# Patient Record
Sex: Female | Born: 1940 | Race: White | Hispanic: No | Marital: Married
Health system: Southern US, Community
[De-identification: ages and names within clinical notes are randomized; demographics above are authoritative.]

---

## 2009-07-24 ENCOUNTER — Ambulatory Visit (HOSPITAL_BASED_OUTPATIENT_CLINIC_OR_DEPARTMENT_OTHER): Admission: RE | Admit: 2009-07-24 | Discharge: 2009-07-24 | Payer: Self-pay | Admitting: Internal Medicine

## 2009-07-24 ENCOUNTER — Ambulatory Visit: Payer: Self-pay | Admitting: Diagnostic Radiology

## 2012-12-17 ENCOUNTER — Other Ambulatory Visit: Payer: Self-pay | Admitting: Neurosurgery

## 2012-12-17 DIAGNOSIS — M5412 Radiculopathy, cervical region: Secondary | ICD-10-CM

## 2012-12-20 ENCOUNTER — Ambulatory Visit
Admission: RE | Admit: 2012-12-20 | Discharge: 2012-12-20 | Disposition: A | Discharge status: Home / Self Care | Payer: Self-pay | Source: Ambulatory Visit | Attending: Neurosurgery | Admitting: Neurosurgery

## 2012-12-20 VITALS — BP 126/64 | HR 97

## 2012-12-20 DIAGNOSIS — M5412 Radiculopathy, cervical region: Secondary | ICD-10-CM

## 2012-12-20 MED ORDER — IOHEXOL 300 MG/ML  SOLN
10.0000 mL | Freq: Once | INTRAMUSCULAR | Status: AC | PRN
  Administered 2012-12-20: 10 mL via INTRATHECAL

## 2012-12-20 MED ORDER — DIAZEPAM 5 MG PO TABS
5.0000 mg | ORAL_TABLET | Freq: Once | ORAL | Status: AC
  Administered 2012-12-20: 5 mg via ORAL

## 2012-12-20 MED ORDER — MEPERIDINE HCL 100 MG/ML IJ SOLN
75.0000 mg | Freq: Once | INTRAMUSCULAR | Status: AC
  Administered 2012-12-20: 75 mg via INTRAMUSCULAR

## 2012-12-20 MED ORDER — ONDANSETRON HCL 4 MG/2ML IJ SOLN
4.0000 mg | Freq: Once | INTRAMUSCULAR | Status: AC
  Administered 2012-12-20: 4 mg via INTRAMUSCULAR

## 2012-12-20 NOTE — Progress Notes (Signed)
Patient states she hasn't taken Sertraline or Trazodone since Monday (12/17/12).  jkl

## 2013-04-22 ENCOUNTER — Other Ambulatory Visit: Payer: Self-pay | Admitting: Sports Medicine

## 2013-04-22 DIAGNOSIS — M545 Low back pain, unspecified: Secondary | ICD-10-CM

## 2013-04-24 ENCOUNTER — Ambulatory Visit
Admission: RE | Admit: 2013-04-24 | Discharge: 2013-04-24 | Disposition: A | Discharge status: Home / Self Care | Payer: Commercial Managed Care - HMO | Source: Ambulatory Visit | Attending: Sports Medicine | Admitting: Sports Medicine

## 2013-04-24 ENCOUNTER — Other Ambulatory Visit: Payer: Self-pay | Admitting: Sports Medicine

## 2013-04-24 ENCOUNTER — Ambulatory Visit: Payer: No Typology Code available for payment source

## 2013-04-24 DIAGNOSIS — M545 Low back pain, unspecified: Secondary | ICD-10-CM

## 2013-04-25 ENCOUNTER — Other Ambulatory Visit: Payer: No Typology Code available for payment source

## 2013-04-26 ENCOUNTER — Other Ambulatory Visit: Payer: No Typology Code available for payment source

## 2013-04-29 ENCOUNTER — Other Ambulatory Visit: Payer: No Typology Code available for payment source

## 2014-03-29 IMAGING — RF DG MYELOGRAM CERVICAL
9 series · 9 of 9 positions shown · non-contrast
Comparison: none

CLINICAL DATA: Bladder stimulator precluding MRI. Cervical
spondylosis with bilateral upper extremity radicular symptoms. Pain
radiating into the right arm and paresthesias in the left arm.
TECHNIQUE: Contiguous axial images were obtained through the Cervical spine
without infusion. Coronal and sagittal reconstructions were obtained
of the axial image sets.

[Series 1: (hospital) · 1 of 1 slices shown]
[im 1/1]
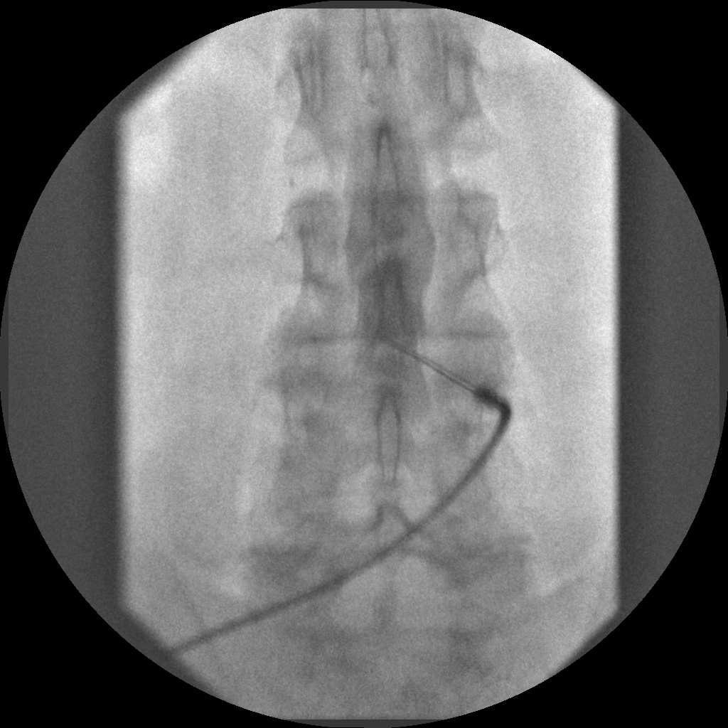

[Series 2: myelogram  white · 1 of 1 slices shown (1 of 8)]
[im 1/1]
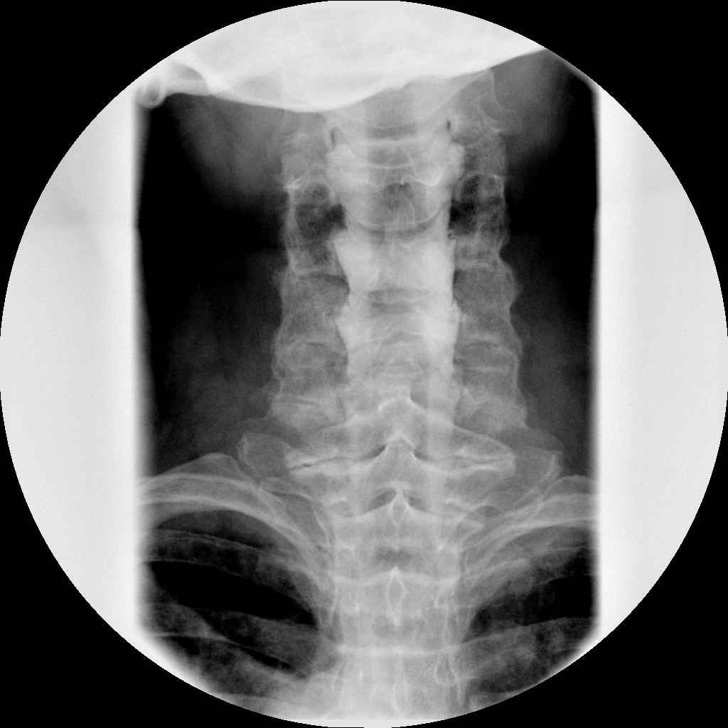

[Series 3: myelogram  white · 1 of 1 slices shown (2 of 8)]
[im 1/1]
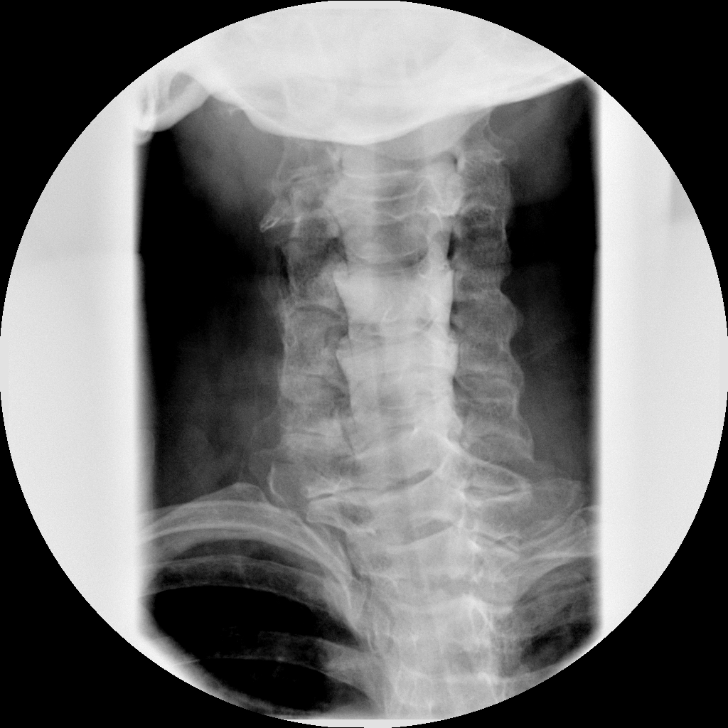

[Series 4: myelogram  white · 1 of 1 slices shown (3 of 8)]
[im 1/1]
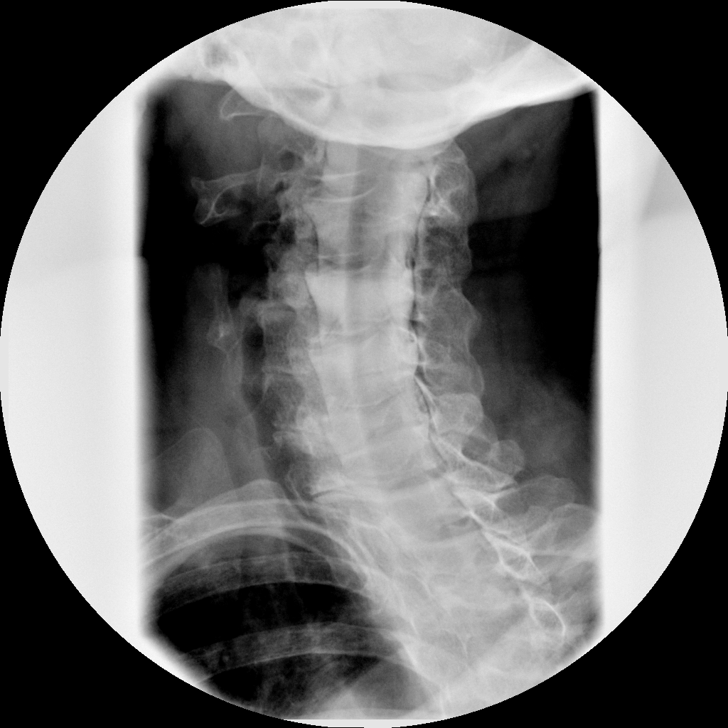

[Series 5: myelogram  white · 1 of 1 slices shown (4 of 8)]
[im 1/1]
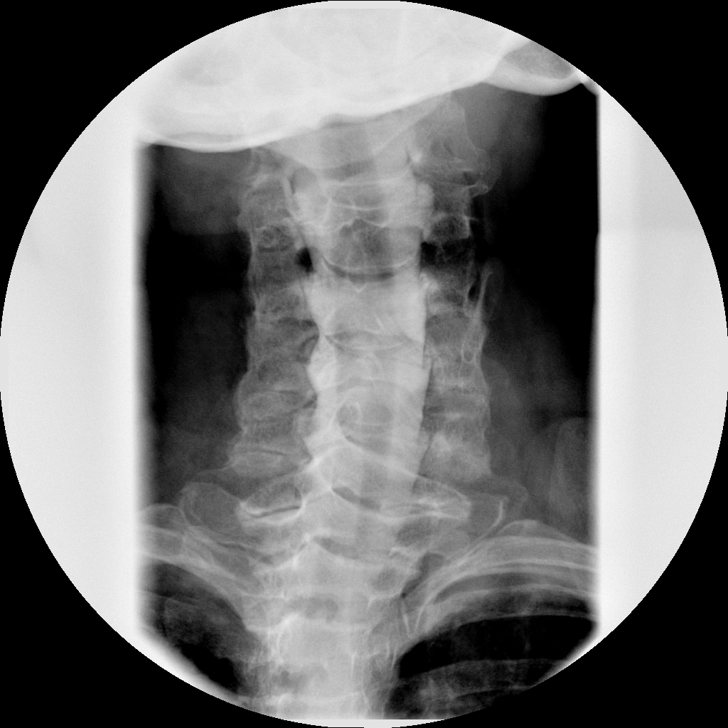

[Series 6: myelogram  white · 1 of 1 slices shown (5 of 8)]
[im 1/1]
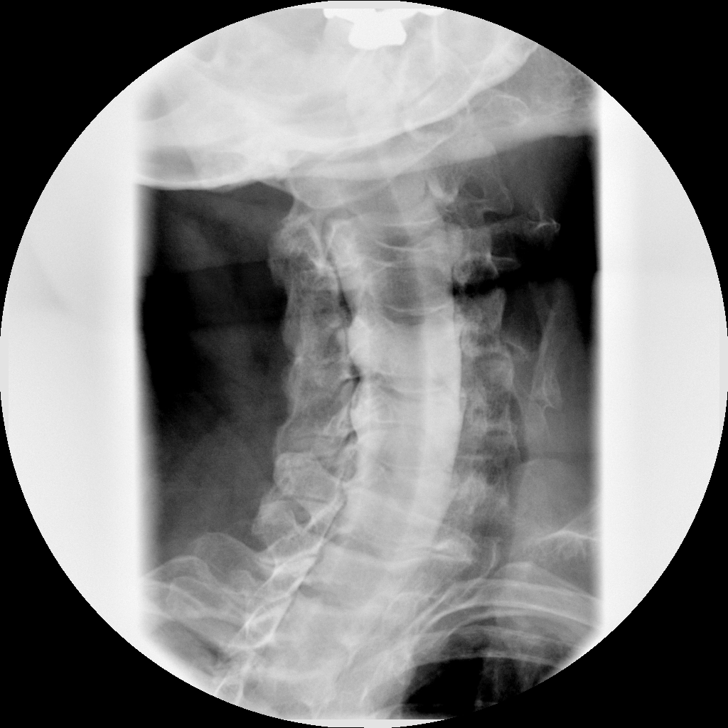

[Series 8: myelogram  white · 1 of 1 slices shown (6 of 8)]
[im 1/1]
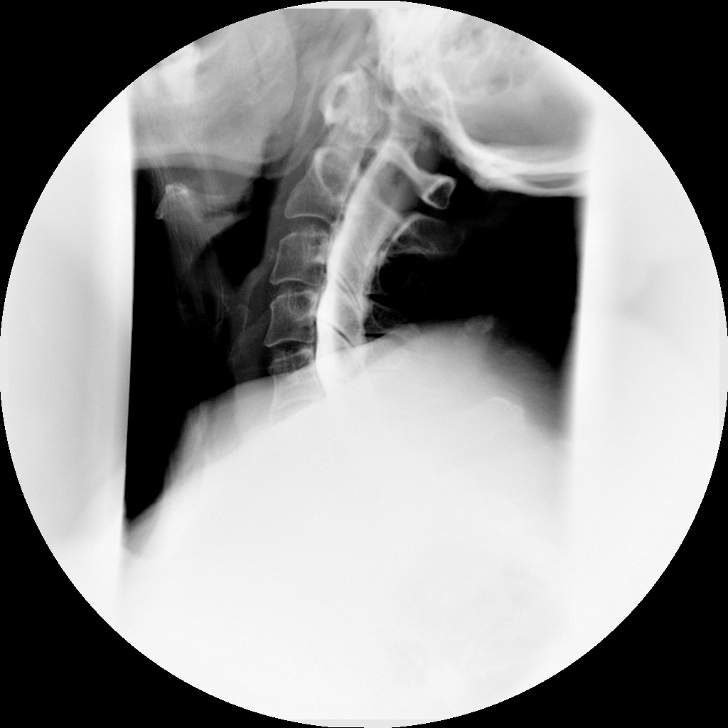

[Series 9: myelogram  white · 1 of 1 slices shown (7 of 8)]
[im 1/1]
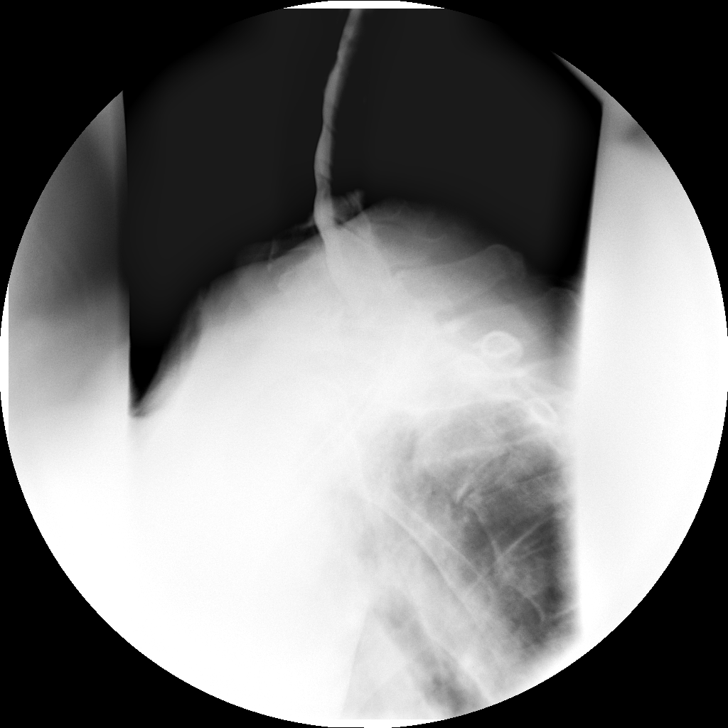

[Series 10: myelogram  white · 1 of 1 slices shown (8 of 8)]
[im 1/1]
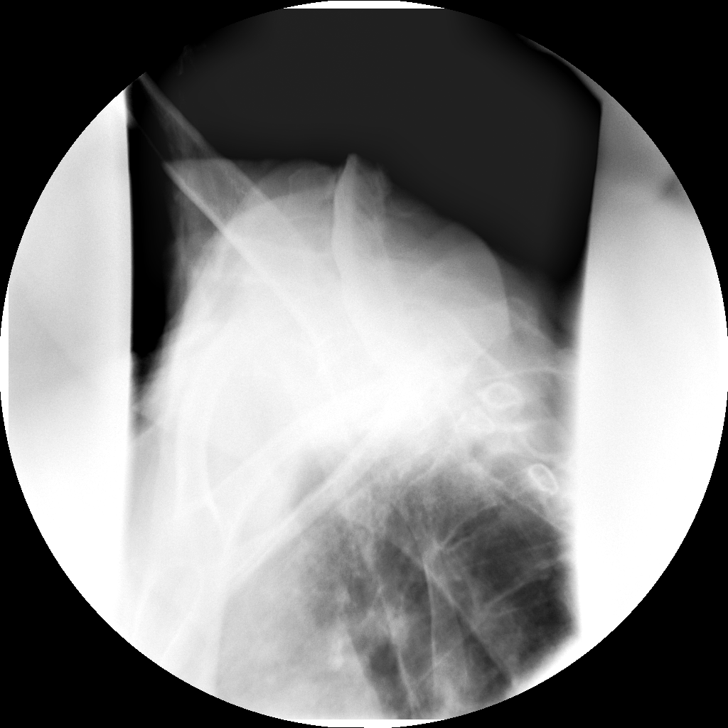

[9 of 9 positions shown; findings below may reference images not displayed]

FLUOROSCOPY TIME:  0 min 56 seconds

PROCEDURE:
LUMBAR PUNCTURE FOR CERVICAL MYELOGRAM

After thorough discussion of risks and benefits of the procedure
including bleeding, infection, injury to nerves, blood vessels,
adjacent structures as well as headache and CSF leak, written and
oral informed consent was obtained. Consent was obtained by Dr.
Fatim Ezzahra Raw. We discussed the high likelihood of obtaining a
diagnostic study.

Patient was positioned prone on the fluoroscopy table. Local
anesthesia was provided with 1% lidocaine without epinephrine after
prepped and draped in the usual sterile fashion. Puncture was
performed at L3-L4 using a 3 1/2 inch 22 gauge pencil point Reuben
via right paramedian approach. Using a single pass through the dura,
the needle was placed within the thecal sac, with return of clear
CSF. 8 mL of Amnipaque-[id number] was injected into the thecal sac, with
normal opacification of the nerve roots and cauda equina consistent
with free flow within the subarachnoid space. The patient was then
moved to the Trendelenburg position and contrast flowed into the
Cervical spine region.

I personally performed the lumbar puncture and administered the
intrathecal contrast. I also personally supervised acquisition of
the myelogram images.
FINDINGS: CERVICAL MYELOGRAM FINDINGS:

The cervical spinal canal appears capacious. No significant central
stenosis is identified. The alignment appears anatomic.

CT CERVICAL MYELOGRAM FINDINGS:

There is a mild positional dextro convex torticollis of the cervical
spine. Incidental imaging of the lung apices demonstrates likely
scarring at the right apex. Posterior fossa structures grossly
appear within normal limits. Atlantodental degenerative disease.
Craniocervical alignment is normal. Prevertebral soft tissues are
normal. Calcification is present in the nuchal ligament dorsally.

C2-C3: Mild left facet degeneration. Central canal and foramina are
patent. Disk appears normal.

C3-C4: Disk degeneration with loss of height. Shallow
circumferential disc bulging. Central canal widely patent. Right
foramen patent. Mild bilateral facet arthrosis. Left facet arthrosis
produces mild encroachment on the left neural foramen potentially
affecting the left C4 nerve.

C4-C5: Disk degeneration with shallow left eccentric disc osteophyte
complex. Left-greater-than-right mild bilateral facet arthrosis.
There is left foraminal encroachment which is mild, potentially
affecting the left C5 nerve. The right neural foramen appears
patent. Central canal widely patent.

C5-C6: Disk degeneration with circumferential disk bulge and annular
calcification. Central canal is widely patent. Mild bilateral facet
arthrosis. The neural foramina appear patent.

C6-C7: Preserved disc height. Mild annular calcification and a tiny
effusion filled subchondral cyst in the superior C7 endplate.
Central canal and foramina are widely patent.

C7-T1:  Disk desiccation. No stenosis.
IMPRESSION: Mild multilevel cervical spondylosis without central stenosis. Mild
foraminal stenosis in the upper to mid cervical spine due to facet
arthrosis and uncovertebral spurring which potentially affects the
left C4 and C5 nerves.
